# Patient Record
Sex: Male | Born: 1975 | Race: White | Hispanic: No | Marital: Married | Smoking: Former smoker
Health system: Southern US, Community
[De-identification: ages and names within clinical notes are randomized; demographics above are authoritative.]

## PROBLEM LIST (undated history)

## (undated) DIAGNOSIS — K219 Gastro-esophageal reflux disease without esophagitis: Secondary | ICD-10-CM

## (undated) DIAGNOSIS — E079 Disorder of thyroid, unspecified: Secondary | ICD-10-CM

## (undated) DIAGNOSIS — T7840XA Allergy, unspecified, initial encounter: Secondary | ICD-10-CM

## (undated) HISTORY — DX: Allergy, unspecified, initial encounter: T78.40XA

## (undated) HISTORY — DX: Disorder of thyroid, unspecified: E07.9

## (undated) HISTORY — DX: Gastro-esophageal reflux disease without esophagitis: K21.9

---

## 2000-06-24 HISTORY — PX: APPENDECTOMY: SHX54

## 2015-07-17 ENCOUNTER — Encounter: Payer: Self-pay | Admitting: Family Medicine

## 2015-07-17 ENCOUNTER — Ambulatory Visit (INDEPENDENT_AMBULATORY_CARE_PROVIDER_SITE_OTHER): Payer: BLUE CROSS/BLUE SHIELD | Admitting: Family Medicine

## 2015-07-17 VITALS — BP 122/80 | HR 64 | Resp 16 | Ht 75.0 in | Wt 247.0 lb

## 2015-07-17 DIAGNOSIS — J302 Other seasonal allergic rhinitis: Secondary | ICD-10-CM | POA: Diagnosis not present

## 2015-07-17 DIAGNOSIS — E039 Hypothyroidism, unspecified: Secondary | ICD-10-CM | POA: Diagnosis not present

## 2015-07-17 DIAGNOSIS — E669 Obesity, unspecified: Secondary | ICD-10-CM | POA: Diagnosis not present

## 2015-07-17 DIAGNOSIS — K219 Gastro-esophageal reflux disease without esophagitis: Secondary | ICD-10-CM | POA: Diagnosis not present

## 2015-07-17 DIAGNOSIS — Z23 Encounter for immunization: Secondary | ICD-10-CM | POA: Diagnosis not present

## 2015-07-17 DIAGNOSIS — E559 Vitamin D deficiency, unspecified: Secondary | ICD-10-CM

## 2015-07-17 DIAGNOSIS — L853 Xerosis cutis: Secondary | ICD-10-CM | POA: Diagnosis not present

## 2015-07-18 LAB — COMPREHENSIVE METABOLIC PANEL
ALT: 16 IU/L (ref 0–44)
AST: 14 IU/L (ref 0–40)
Albumin/Globulin Ratio: 2 (ref 1.1–2.5)
Albumin: 4.8 g/dL (ref 3.5–5.5)
Alkaline Phosphatase: 75 IU/L (ref 39–117)
BUN/Creatinine Ratio: 17 (ref 8–19)
BUN: 20 mg/dL (ref 6–20)
Bilirubin Total: 0.4 mg/dL (ref 0.0–1.2)
CALCIUM: 9.4 mg/dL (ref 8.7–10.2)
CO2: 24 mmol/L (ref 18–29)
CREATININE: 1.17 mg/dL (ref 0.76–1.27)
Chloride: 101 mmol/L (ref 96–106)
GFR calc Af Amer: 90 mL/min/{1.73_m2} (ref >59)
GFR, EST NON AFRICAN AMERICAN: 78 mL/min/{1.73_m2} (ref >59)
GLOBULIN, TOTAL: 2.4 g/dL (ref 1.5–4.5)
Glucose: 90 mg/dL (ref 65–99)
Potassium: 4.1 mmol/L (ref 3.5–5.2)
SODIUM: 141 mmol/L (ref 134–144)
Total Protein: 7.2 g/dL (ref 6.0–8.5)

## 2015-07-18 LAB — LIPID PANEL
CHOLESTEROL TOTAL: 187 mg/dL (ref 100–199)
Chol/HDL Ratio: 4.2 ratio units (ref 0.0–5.0)
HDL: 45 mg/dL (ref >39)
LDL CALC: 103 mg/dL — AB (ref 0–99)
Triglycerides: 193 mg/dL — ABNORMAL HIGH (ref 0–149)
VLDL Cholesterol Cal: 39 mg/dL (ref 5–40)

## 2015-07-18 LAB — CBC
HEMOGLOBIN: 14.6 g/dL (ref 12.6–17.7)
Hematocrit: 43.7 % (ref 37.5–51.0)
MCH: 29.8 pg (ref 26.6–33.0)
MCHC: 33.4 g/dL (ref 31.5–35.7)
MCV: 89 fL (ref 79–97)
Platelets: 276 10*3/uL (ref 150–379)
RBC: 4.9 x10E6/uL (ref 4.14–5.80)
RDW: 13.4 % (ref 12.3–15.4)
WBC: 8.1 10*3/uL (ref 3.4–10.8)

## 2015-07-18 LAB — VITAMIN D 25 HYDROXY (VIT D DEFICIENCY, FRACTURES): Vit D, 25-Hydroxy: 22.1 ng/mL — ABNORMAL LOW (ref 30.0–100.0)

## 2015-07-18 LAB — TSH: TSH: 6.12 u[IU]/mL — ABNORMAL HIGH (ref 0.450–4.500)

## 2015-07-19 DIAGNOSIS — E559 Vitamin D deficiency, unspecified: Secondary | ICD-10-CM | POA: Insufficient documentation

## 2015-07-19 DIAGNOSIS — E039 Hypothyroidism, unspecified: Secondary | ICD-10-CM | POA: Insufficient documentation

## 2015-07-19 MED ORDER — LEVOTHYROXINE SODIUM 25 MCG PO TABS: 25.0000 ug | ORAL_TABLET | Freq: Every day | ORAL | Status: DC

## 2015-07-19 NOTE — Progress Notes (Signed)
Date:  07/17/2015   Name:  Joshua Barrera   DOB:  1976/03/24   MRN:  [id number]  PCP:  No primary care provider on file.    Chief Complaint: Establish Care   History of Present Illness:  This is a 40 y.o. male to establish care. Weight increased 20-25# past year. Takes OTC omeprazole 3-5 times/week for several years for GERD, never had EGD. Lipids checked 5 yrs ago, ok then. Father died NHL/PE, mother with depression, sibs healthy. Needs flu and tetanus imms. Has occ loose stools.  Review of Systems:  Review of Systems  Constitutional: Negative for fever and fatigue.  HENT: Negative for ear pain and sore throat.   Eyes: Negative for pain.  Respiratory: Negative for shortness of breath.   Cardiovascular: Negative for chest pain and leg swelling.  Gastrointestinal: Negative for abdominal pain.  Endocrine: Negative for polyuria.  Genitourinary: Negative for difficulty urinating.  Neurological: Negative for syncope and light-headedness.    Patient Active Problem List   Diagnosis Date Noted  . Acid reflux 07/17/2015  . Seasonal allergies 07/17/2015  . Dry skin 07/17/2015    Prior to Admission medications   Medication Sig Start Date End Date Taking? Authorizing Provider  omeprazole (PRILOSEC) 20 MG capsule Take 20 mg by mouth daily.   Yes Historical Provider, MD    Allergies  Allergen Reactions  . Latex     Past Surgical History  Procedure Laterality Date  . Appendectomy  2002    Social History  Substance Use Topics  . Smoking status: Former Smoker    Types: Cigarettes, Cigars    Quit date: 07/17/2003  . Smokeless tobacco: Never Used  . Alcohol Use: 3.0 oz/week    5 Cans of beer per week    Family History  Problem Relation Age of Onset  . Lymphoma Father   . Cancer Maternal Grandmother   . Cancer Paternal Grandmother   . Birth defects Paternal Grandmother   . Heart attack Paternal Grandfather     Medication list has been reviewed and updated.  Physical  Examination: BP 122/80 mmHg  Pulse 64  Resp 16  Ht  (1.854 m)  Wt 247 lb (112.038 kg)  BMI 32.59 kg/m2  SpO2 98%  Physical Exam  Constitutional: He is oriented to person, place, and time. He appears well-developed and well-nourished.  HENT:  Head: Normocephalic and atraumatic.  Right Ear: External ear normal.  Left Ear: External ear normal.  Nose: Nose normal.  Mouth/Throat: Oropharynx is clear and moist.  TM's clear  Eyes: Conjunctivae and EOM are normal. Pupils are equal, round, and reactive to light.  Neck: Neck supple. No thyromegaly present.  Cardiovascular: Normal rate, regular rhythm and normal heart sounds.   Pulmonary/Chest: Effort normal and breath sounds normal.  Abdominal: Soft. He exhibits no distension and no mass. There is no tenderness.  Musculoskeletal: He exhibits no edema.  Lymphadenopathy:    He has no cervical adenopathy.  Neurological: He is alert and oriented to person, place, and time. Coordination normal.  Skin: Skin is warm and dry.  Psychiatric: He has a normal mood and affect. His behavior is normal.  Nursing note and vitals reviewed.   Assessment and Plan:  1. Gastroesophageal reflux disease, esophagitis presence not specified Well controlled on omeprazole, advised using prn only  2. Seasonal allergies Well controlled on OTC meds  3. Dry skin Lubricant bid and within 3 minutes of bath/shower  4. Obesity, Class I, BMI 30-34.9 Exercise (150 mins/wk)  and weight loss discussed - Lipid Profile - Comprehensive Metabolic Panel (CMET) - CBC - TSH - Vitamin D (25 hydroxy)  5. Need for influenza vaccination - Flu Vaccine QUAD 36+ mos PF IM (Fluarix & Fluzone Quad PF)  6. Need for Tdap vaccination - Tdap vaccine greater than or equal to 7yo IM  Return in about 4 weeks (around 08/14/2015).  Dionne Ano. Kingsley Spittle MD [facility]  07/19/2015

## 2015-07-19 NOTE — Addendum Note (Signed)
Addended by: Schuyler Amor on: 07/19/2015 10:50 AM   Modules accepted: Orders

## 2015-08-14 ENCOUNTER — Ambulatory Visit (INDEPENDENT_AMBULATORY_CARE_PROVIDER_SITE_OTHER): Payer: BLUE CROSS/BLUE SHIELD | Admitting: Family Medicine

## 2015-08-14 ENCOUNTER — Encounter: Payer: Self-pay | Admitting: Family Medicine

## 2015-08-14 VITALS — BP 136/80 | HR 64 | Ht 75.0 in | Wt 244.0 lb

## 2015-08-14 DIAGNOSIS — K219 Gastro-esophageal reflux disease without esophagitis: Secondary | ICD-10-CM

## 2015-08-14 DIAGNOSIS — E039 Hypothyroidism, unspecified: Secondary | ICD-10-CM

## 2015-08-14 DIAGNOSIS — E559 Vitamin D deficiency, unspecified: Secondary | ICD-10-CM

## 2015-08-14 DIAGNOSIS — R0789 Other chest pain: Secondary | ICD-10-CM | POA: Diagnosis not present

## 2015-08-14 DIAGNOSIS — J302 Other seasonal allergic rhinitis: Secondary | ICD-10-CM | POA: Diagnosis not present

## 2015-08-14 MED ORDER — VITAMIN D 50 MCG (2000 UT) PO CAPS: 1.0000 | ORAL_CAPSULE | Freq: Every day | ORAL | Status: DC

## 2015-08-14 NOTE — Progress Notes (Signed)
Date:  08/14/2015   Name:  Joshua Barrera   DOB:  30-Apr-1976   MRN:  [id number]  PCP:  No primary care provider on file.    Chief Complaint: Follow-up   History of Present Illness:  This is a 40 y.o. male seen in f/u from initial visit 1 month ago. Blood work showed hypothyroidism and vit D def, taking Synthroid and vit D supp daily. Has been having some int R upper chest pain past 2 months, exertional and non-exertional, often worse with deep breath and R arm movement, no ass nausea or SOB. Reflux about the same, using omeprazole 3-4x/wk. AR about the same, has lost 3# past month, exercising more.  Review of Systems:  Review of Systems  Constitutional: Negative for fever.  Respiratory: Negative for shortness of breath.   Cardiovascular: Negative for leg swelling.  Gastrointestinal: Negative for abdominal pain.  Neurological: Negative for syncope and light-headedness.    Patient Active Problem List   Diagnosis Date Noted  . Hypothyroidism 07/19/2015  . Vitamin D deficiency 07/19/2015  . Acid reflux 07/17/2015  . Seasonal allergies 07/17/2015  . Dry skin 07/17/2015    Prior to Admission medications   Medication Sig Start Date End Date Taking? Authorizing Provider  levothyroxine (SYNTHROID, LEVOTHROID) 25 MCG tablet Take 1 tablet (25 mcg total) by mouth daily before breakfast. 07/19/15  Yes Schuyler Amor, MD  omeprazole (PRILOSEC) 20 MG capsule Take 20 mg by mouth daily.   Yes Historical Provider, MD  Cholecalciferol (VITAMIN D) 2000 units CAPS Take 1 capsule (2,000 Units total) by mouth daily. 08/14/15   Schuyler Amor, MD    Allergies  Allergen Reactions  . Latex     Past Surgical History  Procedure Laterality Date  . Appendectomy  2002    Social History  Substance Use Topics  . Smoking status: Former Smoker    Types: Cigarettes, Cigars    Quit date: 07/17/2003  . Smokeless tobacco: Never Used  . Alcohol Use: 3.0 oz/week    5 Cans of beer per week    Family History   Problem Relation Age of Onset  . Lymphoma Father   . Cancer Maternal Grandmother   . Cancer Paternal Grandmother   . Birth defects Paternal Grandmother   . Heart attack Paternal Grandfather     Medication list has been reviewed and updated.  Physical Examination: BP 136/80 mmHg  Pulse 64  Ht  (1.905 m)  Wt 244 lb (110.678 kg)  BMI 30.50 kg/m2  Physical Exam  Constitutional: He appears well-developed and well-nourished.  Cardiovascular: Normal rate, regular rhythm and normal heart sounds.   Pulmonary/Chest: Effort normal and breath sounds normal.  Musculoskeletal: He exhibits no edema.  Mild R upper chest pain with raising R arm, chest non-tender  Nursing note and vitals reviewed.   Assessment and Plan:  1. Musculoskeletal chest pain Likely benign, not progressive, monitor, consider CXR/NSAID if sxs worsen/persist  2. Hypothyroidism, unspecified hypothyroidism type On Synthroid x 1 month - TSH  3. Vitamin D deficiency On supplement x 1 month - Vitamin D (25 hydroxy)  4. Gastroesophageal reflux disease, esophagitis presence not specified Adequate control on prn omeprazole, discussed limiting use  5. Seasonal allergies Stable  Return in about 3 months (around 11/11/2015).  Dionne Ano. Kingsley Spittle MD [facility]  08/14/2015

## 2015-08-15 LAB — VITAMIN D 25 HYDROXY (VIT D DEFICIENCY, FRACTURES): Vit D, 25-Hydroxy: 23.3 ng/mL — ABNORMAL LOW (ref 30.0–100.0)

## 2015-08-15 LAB — TSH: TSH: 3.14 u[IU]/mL (ref 0.450–4.500)

## 2015-08-16 ENCOUNTER — Telehealth: Payer: Self-pay

## 2015-08-16 NOTE — Telephone Encounter (Signed)
Left message for patient to call back  

## 2015-08-16 NOTE — Telephone Encounter (Signed)
-----   Message from Schuyler Amor, MD sent at 08/15/2015  9:11 AM EST ----- Inform thyroid level now normal but vit D level only slightly improved, recommend increase vit D supplementation to 4000 units daily (double current dose).

## 2015-08-16 NOTE — Telephone Encounter (Signed)
Spoke with patient. Patient advised of all results and verbalized understanding. Will call back with any future questions or concerns. MAH  

## 2015-11-10 ENCOUNTER — Ambulatory Visit: Payer: BLUE CROSS/BLUE SHIELD | Admitting: Family Medicine

## 2015-11-15 ENCOUNTER — Ambulatory Visit (INDEPENDENT_AMBULATORY_CARE_PROVIDER_SITE_OTHER): Payer: BLUE CROSS/BLUE SHIELD | Admitting: Family Medicine

## 2015-11-15 ENCOUNTER — Encounter: Payer: Self-pay | Admitting: Family Medicine

## 2015-11-15 VITALS — BP 120/82 | HR 80 | Ht 75.0 in | Wt 250.0 lb

## 2015-11-15 DIAGNOSIS — E559 Vitamin D deficiency, unspecified: Secondary | ICD-10-CM

## 2015-11-15 DIAGNOSIS — E669 Obesity, unspecified: Secondary | ICD-10-CM

## 2015-11-15 DIAGNOSIS — E039 Hypothyroidism, unspecified: Secondary | ICD-10-CM

## 2015-11-15 DIAGNOSIS — K219 Gastro-esophageal reflux disease without esophagitis: Secondary | ICD-10-CM

## 2015-11-15 DIAGNOSIS — E66811 Obesity, class 1: Secondary | ICD-10-CM | POA: Insufficient documentation

## 2015-11-15 MED ORDER — CHOLECALCIFEROL 125 MCG (5000 UT) PO CAPS: 5000.0000 [IU] | ORAL_CAPSULE | Freq: Every day | ORAL | Status: DC

## 2015-11-15 MED ORDER — LEVOTHYROXINE SODIUM 25 MCG PO TABS: 25.0000 ug | ORAL_TABLET | Freq: Every day | ORAL | Status: DC

## 2015-11-15 NOTE — Progress Notes (Signed)
Date:  11/15/2015   Name:  Joshua Barrera   DOB:  30-Jan-1976   MRN:  [id number]  PCP:  Schuyler AmorWilliam Bevan Disney, MD    Chief Complaint: Follow-up   History of Present Illness:  This is a 40 y.o. male seen for 3 month f/u. Chest pains have resolved but GERD is worse, having to take Prilosec daily now to control sxs. Having trouble remembering to take Synthroid and vit D supp regularly. Weight up 6#, trying to exercise more. AR sxs stable. No new concerns.  Review of Systems:  Review of Systems  Constitutional: Negative for fever and fatigue.  Respiratory: Negative for cough and shortness of breath.   Cardiovascular: Negative for chest pain and leg swelling.  Neurological: Negative for syncope and light-headedness.    Patient Active Problem List   Diagnosis Date Noted  . Obesity, Class I, BMI 30-34.9 11/15/2015  . Hypothyroidism 07/19/2015  . Vitamin D deficiency 07/19/2015  . Acid reflux 07/17/2015  . Seasonal allergies 07/17/2015  . Dry skin 07/17/2015    Prior to Admission medications   Medication Sig Start Date End Date Taking? Authorizing Provider  omeprazole (PRILOSEC) 20 MG capsule Take 20 mg by mouth daily.   Yes Historical Provider, MD  Cholecalciferol 5000 units capsule Take 1 capsule (5,000 Units total) by mouth daily. 11/15/15   Schuyler AmorWilliam Byard Carranza, MD  levothyroxine (SYNTHROID, LEVOTHROID) 25 MCG tablet Take 1 tablet (25 mcg total) by mouth daily. 11/15/15   Schuyler AmorWilliam Ansley Mangiapane, MD    Allergies  Allergen Reactions  . Latex     Past Surgical History  Procedure Laterality Date  . Appendectomy  2002    Social History  Substance Use Topics  . Smoking status: Former Smoker    Types: Cigarettes, Cigars    Quit date: 07/17/2003  . Smokeless tobacco: Never Used  . Alcohol Use: 3.0 oz/week    5 Cans of beer per week    Family History  Problem Relation Age of Onset  . Lymphoma Father   . Cancer Maternal Grandmother   . Cancer Paternal Grandmother   . Birth defects Paternal  Grandmother   . Heart attack Paternal Grandfather     Medication list has been reviewed and updated.  Physical Examination: BP 120/82 mmHg  Pulse 80  Ht 6\' 3"  (1.905 m)  Wt 250 lb (113.399 kg)  BMI 31.25 kg/m2  Physical Exam  Constitutional: He appears well-developed and well-nourished.  Cardiovascular: Normal rate, regular rhythm and normal heart sounds.   Pulmonary/Chest: Effort normal and breath sounds normal.  Musculoskeletal: He exhibits no edema.  Neurological: He is alert.  Skin: Skin is warm and dry.  Psychiatric: He has a normal mood and affect. His behavior is normal.  Nursing note and vitals reviewed.   Assessment and Plan:  1. Hypothyroidism, unspecified hypothyroidism type Well controlled, cont Synthroid, consider repeat TSH next visit  2. Gastroesophageal reflux disease, esophagitis presence not specified Worse, strategies discussed, cont omeprazole, try to avoid daily use, consider EGD if sxs persist  3. Vitamin D deficiency On increased supplement but not taking regularly, continue supplement, consider repeat level next visit  4. Obesity, Class I, BMI 30-34.9 Exercise, weight loss discussed  Return in about 6 months (around 05/17/2016).  Dionne AnoWilliam M. Kingsley SpittlePlonk, Jr. MD [facility]  11/15/2015

## 2015-11-15 NOTE — Patient Instructions (Signed)

## 2015-11-29 ENCOUNTER — Other Ambulatory Visit: Payer: Self-pay | Admitting: Family Medicine

## 2015-11-29 ENCOUNTER — Telehealth: Payer: Self-pay

## 2015-11-29 MED ORDER — LEVOTHYROXINE SODIUM 25 MCG PO TABS: 25.0000 ug | ORAL_TABLET | Freq: Every day | ORAL | Status: DC

## 2015-11-29 NOTE — Telephone Encounter (Signed)
Rx Synthroid sent

## 2015-11-29 NOTE — Telephone Encounter (Signed)
Sent to [facility] 

## 2016-05-15 ENCOUNTER — Ambulatory Visit: Payer: BLUE CROSS/BLUE SHIELD | Admitting: Internal Medicine

## 2016-05-21 ENCOUNTER — Ambulatory Visit: Payer: BLUE CROSS/BLUE SHIELD | Admitting: Internal Medicine

## 2016-05-22 ENCOUNTER — Ambulatory Visit (INDEPENDENT_AMBULATORY_CARE_PROVIDER_SITE_OTHER): Payer: BLUE CROSS/BLUE SHIELD | Admitting: Internal Medicine

## 2016-05-22 ENCOUNTER — Encounter: Payer: Self-pay | Admitting: Internal Medicine

## 2016-05-22 VITALS — BP 118/80 | HR 93 | Resp 16 | Ht 75.0 in | Wt 258.8 lb

## 2016-05-22 DIAGNOSIS — Z23 Encounter for immunization: Secondary | ICD-10-CM

## 2016-05-22 DIAGNOSIS — E039 Hypothyroidism, unspecified: Secondary | ICD-10-CM

## 2016-05-22 DIAGNOSIS — K219 Gastro-esophageal reflux disease without esophagitis: Secondary | ICD-10-CM | POA: Diagnosis not present

## 2016-05-22 DIAGNOSIS — E559 Vitamin D deficiency, unspecified: Secondary | ICD-10-CM | POA: Diagnosis not present

## 2016-05-22 MED ORDER — RANITIDINE HCL 300 MG PO TABS: 300.0000 mg | ORAL_TABLET | Freq: Every day | ORAL | 5 refills | Status: DC

## 2016-05-22 NOTE — Progress Notes (Signed)
Date:  05/22/2016   Name:  Joshua Barrera   DOB:  Oct 04, 1975   MRN:  [id number]   Chief Complaint: Hypothyroidism (6 mo fu just started taking meds again after being off 2-3 months. )  Thyroid Problem  Presents for follow-up visit. Symptoms include weight gain. Patient reports no constipation, diaphoresis, dry skin, fatigue or palpitations. (Stopped medication for several months then restarted it last week) The symptoms have been stable.  Gastroesophageal Reflux  He complains of abdominal pain and heartburn. He reports no chest pain, no nausea, no sore throat or no wheezing. This is a chronic problem. The problem occurs frequently. The heartburn is located in the substernum. The heartburn wakes him from sleep. The symptoms are aggravated by lying down. Pertinent negatives include no fatigue. There are no known risk factors.  He stopped nexium over concerns for other side effects.  He has waterbrash most nights - not quite as severe since raising the head of his bed.   Review of Systems  Constitutional: Positive for weight gain. Negative for diaphoresis, fatigue and unexpected weight change.  HENT: Negative for sore throat.   Eyes: Negative for visual disturbance.  Respiratory: Negative for chest tightness, shortness of breath and wheezing.   Cardiovascular: Negative for chest pain, palpitations and leg swelling.  Gastrointestinal: Positive for abdominal pain and heartburn. Negative for blood in stool, constipation, nausea and vomiting.  Neurological: Negative for dizziness and weakness.  Psychiatric/Behavioral: Negative for sleep disturbance.    Patient Active Problem List   Diagnosis Date Noted  . Obesity, Class I, BMI 30-34.9 11/15/2015  . Hypothyroidism 07/19/2015  . Vitamin D deficiency 07/19/2015  . Acid reflux 07/17/2015  . Seasonal allergies 07/17/2015    Prior to Admission medications   Medication Sig Start Date End Date Taking? Authorizing Provider  Cholecalciferol 5000  units capsule Take 1 capsule (5,000 Units total) by mouth daily. 11/15/15  Yes Schuyler AmorWilliam [facility], MD  levothyroxine (SYNTHROID, LEVOTHROID) 25 MCG tablet Take 1 tablet (25 mcg total) by mouth daily. 11/29/15  Yes Schuyler AmorWilliam [facility], MD    Allergies  Allergen Reactions  . Latex     Past Surgical History:  Procedure Laterality Date  . APPENDECTOMY  2002    Social History  Substance Use Topics  . Smoking status: Former Smoker    Types: Cigarettes, Cigars    Quit date: 07/17/2003  . Smokeless tobacco: Never Used  . Alcohol use 3.0 oz/week    5 Cans of beer per week     Medication list has been reviewed and updated.   Physical Exam  Constitutional: He is oriented to person, place, and time. He appears well-developed. No distress.  HENT:  Head: Normocephalic and atraumatic.  Neck: Normal range of motion. Neck supple. No thyromegaly present.  Cardiovascular: Normal rate, regular rhythm and normal heart sounds.   Pulmonary/Chest: Effort normal and breath sounds normal. No respiratory distress. He has no wheezes.  Abdominal: Soft. Bowel sounds are normal. He exhibits no mass. There is no tenderness. There is no rebound and no guarding.  Musculoskeletal: He exhibits no edema or tenderness.  Neurological: He is alert and oriented to person, place, and time.  Skin: Skin is warm and dry. No rash noted.  Psychiatric: He has a normal mood and affect. His behavior is normal. Thought content normal.  Nursing note and vitals reviewed.   BP 118/80   Pulse 93   Resp 16   Ht 6\' 3"  (1.905 m)   Wt 258 lb  12.8 oz (117.4 kg)   SpO2 98%   BMI 32.35 kg/m   Assessment and Plan: 1. Hypothyroidism, unspecified type Continue synthroid daily- return for labs in 2 months - TSH; Future  2. Gastroesophageal reflux disease, esophagitis presence not specified Not controlled off of nexium - will try zantac and test for H pylori - ranitidine (ZANTAC) 300 MG tablet; Take 1 tablet (300 mg total) by mouth at  bedtime.  Dispense: 30 tablet; Refill: 5 - H. pylori antibody, IgG; Future  3. Vitamin D deficiency Continue supplementation   Bari EdwardLaura Chery Giusto, MD [facility]  05/22/2016

## 2016-06-06 ENCOUNTER — Telehealth: Payer: Self-pay | Admitting: Internal Medicine

## 2016-06-06 NOTE — Telephone Encounter (Signed)
Pt called stated joined the gymn--need to have a approval letter due of having thyroid condition send to the trainer to do assesment

## 2016-06-07 NOTE — Telephone Encounter (Signed)
Note written on Rx pad,  He can pick it up.

## 2017-01-20 ENCOUNTER — Ambulatory Visit: Payer: BLUE CROSS/BLUE SHIELD | Admitting: Internal Medicine

## 2017-01-31 ENCOUNTER — Encounter: Payer: Self-pay | Admitting: Internal Medicine

## 2017-01-31 ENCOUNTER — Ambulatory Visit (INDEPENDENT_AMBULATORY_CARE_PROVIDER_SITE_OTHER): Payer: BLUE CROSS/BLUE SHIELD | Admitting: Internal Medicine

## 2017-01-31 VITALS — BP 122/70 | HR 73 | Temp 98.1°F | Ht 75.0 in | Wt 258.4 lb

## 2017-01-31 DIAGNOSIS — E559 Vitamin D deficiency, unspecified: Secondary | ICD-10-CM

## 2017-01-31 DIAGNOSIS — E034 Atrophy of thyroid (acquired): Secondary | ICD-10-CM | POA: Diagnosis not present

## 2017-01-31 DIAGNOSIS — K219 Gastro-esophageal reflux disease without esophagitis: Secondary | ICD-10-CM

## 2017-01-31 NOTE — Progress Notes (Signed)
Date:  01/31/2017   Name:  Joshua Barrera   DOB:  [birth year]   MRN:  [id number]   Chief Complaint: Hypothyroidism and Gastroesophageal Reflux Gastroesophageal Reflux  He reports no chest pain, no sore throat or no wheezing. Pertinent negatives include no fatigue. He has tried a histamine-2 antagonist for the symptoms. The treatment provided significant relief.  Thyroid Problem  Presents for follow-up (stopped medication at least 5 months ago) visit. Patient reports no constipation, diarrhea, fatigue or palpitations.  He feels well, his energy level, weight and sleep are good.    Review of Systems  Constitutional: Negative for chills, fatigue and fever.  HENT: Negative for sore throat and trouble swallowing.   Respiratory: Negative for chest tightness, shortness of breath and wheezing.   Cardiovascular: Negative for chest pain, palpitations and leg swelling.  Gastrointestinal: Negative for abdominal distention, constipation and diarrhea.  Genitourinary: Negative for difficulty urinating.  Musculoskeletal: Negative for neck pain and neck stiffness.  Neurological: Negative for dizziness and headaches.    Patient Active Problem List   Diagnosis Date Noted  . Hypothyroidism due to acquired atrophy of thyroid 01/31/2017  . Obesity, Class I, BMI 30-34.9 11/15/2015  . Vitamin D deficiency 07/19/2015  . Acid reflux 07/17/2015  . Seasonal allergies 07/17/2015    Prior to Admission medications   Medication Sig Start Date End Date Taking? Authorizing Provider  Cholecalciferol 5000 units capsule Take 1 capsule (5,000 Units total) by mouth daily. 11/15/15   [facility], Chrissie NoaWilliam, MD  levothyroxine (SYNTHROID, LEVOTHROID) 25 MCG tablet Take 1 tablet (25 mcg total) by mouth daily. 11/29/15   [facility], Chrissie NoaWilliam, MD  ranitidine (ZANTAC) 300 MG tablet Take 1 tablet (300 mg total) by mouth at bedtime. 05/22/16   Reubin MilanBerglund, Clearence Vitug H, MD    Allergies  Allergen Reactions  . Latex     Past Surgical History:    Procedure Laterality Date  . APPENDECTOMY  2002    Social History  Substance Use Topics  . Smoking status: Former Smoker    Types: Cigarettes, Cigars    Quit date: 07/17/2003  . Smokeless tobacco: Never Used  . Alcohol use 3.0 oz/week    5 Cans of beer per week     Medication list has been reviewed and updated.   Physical Exam  Constitutional: He is oriented to person, place, and time. He appears well-developed. No distress.  HENT:  Head: Normocephalic and atraumatic.  Neck: Normal range of motion. Neck supple. No thyromegaly present.  Cardiovascular: Normal rate, regular rhythm and normal heart sounds.   Pulmonary/Chest: Effort normal and breath sounds normal. No respiratory distress. He has no wheezes.  Abdominal: Soft. Bowel sounds are normal. He exhibits no distension. There is no tenderness.  Musculoskeletal: Normal range of motion. He exhibits no edema.  Lymphadenopathy:    He has no cervical adenopathy.  Neurological: He is alert and oriented to person, place, and time.  Skin: Skin is warm and dry. No rash noted.  Psychiatric: He has a normal mood and affect. His behavior is normal. Thought content normal.  Nursing note and vitals reviewed.   BP 122/70 (BP Location: Left Arm, Patient Position: Sitting, Cuff Size: Large)   Pulse 73   Temp 98.1 F (36.7 C) (Oral)   Ht 6\' 3"  (1.905 m)   Wt 258 lb 6.4 oz (117.2 kg)   SpO2 97%   BMI 32.30 kg/m   Assessment and Plan: 1. Hypothyroidism due to acquired atrophy of thyroid Remain off medications unless  TSH much higher - TSH  2. Gastroesophageal reflux disease, esophagitis presence not specified Continue zantac PRN  3. Vitamin D deficiency Recommend that he continue supplementation whenever he remembers it.   No orders of the defined types were placed in this encounter.   Bari Edward, MD [facility]  01/31/2017

## 2017-02-14 ENCOUNTER — Telehealth: Payer: Self-pay | Admitting: Internal Medicine

## 2017-02-14 NOTE — Telephone Encounter (Signed)
Call patient to find out why he did not get thyroid labs done.

## 2017-02-14 NOTE — Telephone Encounter (Signed)
Called pt and left Vm. Awaiting call back.

## 2017-05-21 ENCOUNTER — Ambulatory Visit (INDEPENDENT_AMBULATORY_CARE_PROVIDER_SITE_OTHER): Payer: BLUE CROSS/BLUE SHIELD | Admitting: Internal Medicine

## 2017-05-21 ENCOUNTER — Encounter: Payer: Self-pay | Admitting: Internal Medicine

## 2017-05-21 VITALS — BP 118/78 | HR 89 | Ht 75.0 in | Wt 244.0 lb

## 2017-05-21 DIAGNOSIS — H6692 Otitis media, unspecified, left ear: Secondary | ICD-10-CM | POA: Diagnosis not present

## 2017-05-21 DIAGNOSIS — H9312 Tinnitus, left ear: Secondary | ICD-10-CM | POA: Diagnosis not present

## 2017-05-21 DIAGNOSIS — R1111 Vomiting without nausea: Secondary | ICD-10-CM

## 2017-05-21 MED ORDER — AZITHROMYCIN 250 MG PO TABS: ORAL_TABLET | ORAL | 0 refills | Status: DC

## 2017-05-21 NOTE — Progress Notes (Signed)
Date:  05/21/2017   Name:  Joshua Barrera   DOB:  1975/09/02   MRN:  [id number]   Chief Complaint: Tinnitus (Left ear- started a couple of weeks ago. Thrown up twice in the last two weeks. No pain and drainage.   )  Emesis   This is a recurrent (two total episodes) problem. The emesis has an appearance of stomach contents. There has been no fever. Pertinent negatives include no abdominal pain, chest pain, dizziness, fever, headaches or weight loss. Risk factors: no warning both times.  He did stop taking zantac a few months ago when he changed his diet.  He has had no gerd sx. He has tried nothing for the symptoms.   Tinnitus - started about 3 weeks ago in the left ear.  Continuous but louder and softer at times.  No noticeable change in hearing.  No vertigo.  No ear pain or drainage.  No recent URI.  Review of Systems  Constitutional: Negative for diaphoresis, fatigue, fever, unexpected weight change and weight loss.  HENT: Positive for tinnitus. Negative for congestion, sinus pressure, trouble swallowing and voice change.   Eyes: Negative for visual disturbance.  Respiratory: Negative for chest tightness, shortness of breath and wheezing.   Cardiovascular: Negative for chest pain and palpitations.  Gastrointestinal: Positive for vomiting. Negative for abdominal pain, blood in stool, constipation and nausea.  Neurological: Negative for dizziness, tremors and headaches.    Patient Active Problem List   Diagnosis Date Noted  . Hypothyroidism due to acquired atrophy of thyroid 01/31/2017  . Obesity, Class I, BMI 30-34.9 11/15/2015  . Vitamin D deficiency 07/19/2015  . Acid reflux 07/17/2015  . Seasonal allergies 07/17/2015    Prior to Admission medications   Medication Sig Start Date End Date Taking? Authorizing Provider  Cholecalciferol 5000 units capsule Take 1 capsule (5,000 Units total) by mouth daily. Patient not taking: Reported on 05/21/2017 11/15/15   Schuyler AmorPlonk, William, MD    levothyroxine (SYNTHROID, LEVOTHROID) 25 MCG tablet Take 1 tablet (25 mcg total) by mouth daily. Patient not taking: Reported on 05/21/2017 11/29/15   Schuyler AmorPlonk, William, MD  ranitidine (ZANTAC) 300 MG tablet Take 1 tablet (300 mg total) by mouth at bedtime. Patient not taking: Reported on 01/31/2017 05/22/16   Reubin MilanBerglund, Jancarlo Biermann H, MD    Allergies  Allergen Reactions  . Latex     Past Surgical History:  Procedure Laterality Date  . APPENDECTOMY  2002    Social History   Tobacco Use  . Smoking status: Former Smoker    Types: Cigarettes, Cigars    Last attempt to quit: 07/17/2003    Years since quitting: 13.8  . Smokeless tobacco: Never Used  Substance Use Topics  . Alcohol use: Yes    Alcohol/week: 3.0 oz    Types: 5 Cans of beer per week  . Drug use: No     Medication list has been reviewed and updated.  PHQ 2/9 Scores 01/31/2017 11/15/2015 08/14/2015 07/17/2015  PHQ - 2 Score 0 0 0 0    Physical Exam  Constitutional: He is oriented to person, place, and time. He appears well-developed. No distress.  HENT:  Head: Normocephalic and atraumatic.  Right Ear: Tympanic membrane and ear canal normal.  Left Ear: Ear canal normal. Tympanic membrane is erythematous and retracted.  Nose: Right sinus exhibits no maxillary sinus tenderness and no frontal sinus tenderness. Left sinus exhibits no maxillary sinus tenderness and no frontal sinus tenderness.  Mouth/Throat: No posterior oropharyngeal edema  or posterior oropharyngeal erythema.  Neck: Normal range of motion. Neck supple.  Cardiovascular: Normal rate and normal heart sounds.  Pulmonary/Chest: Effort normal and breath sounds normal. No respiratory distress.  Abdominal: Soft. Normal appearance and bowel sounds are normal. There is no tenderness. There is no rigidity, no rebound and no guarding.  Musculoskeletal: Normal range of motion.  Neurological: He is alert and oriented to person, place, and time.  Skin: Skin is warm and dry. No  rash noted.  Psychiatric: He has a normal mood and affect. His behavior is normal. Thought content normal.  Nursing note and vitals reviewed.   BP 118/78   Pulse 89   Ht 6\' 3"  (1.905 m)   Wt 244 lb (110.7 kg)   SpO2 97%   BMI 30.50 kg/m   Assessment and Plan: 1. Otitis of left ear May be contributing to tinnitus  2. Tinnitus of left ear If no improvement after Zpak, will refer to ENT  3. Non-intractable vomiting without nausea, unspecified vomiting type May be due to silent GERD - will monitor for recurrence. Consider trial of Zantac   Meds ordered this encounter  Medications  . azithromycin (ZITHROMAX Z-PAK) 250 MG tablet    Sig: UAD    Dispense:  6 each    Refill:  0    Partially dictated using Animal nutritionistDragon software. Any errors are unintentional.  Bari EdwardLaura Kord Monette, MD [facility]  05/21/2017

## 2017-05-21 NOTE — Patient Instructions (Signed)
Call after antibiotics if the tinnitus is not improved for an ENT referral

## 2017-05-27 ENCOUNTER — Ambulatory Visit: Payer: BLUE CROSS/BLUE SHIELD | Admitting: Internal Medicine

## 2017-06-19 ENCOUNTER — Telehealth: Payer: Self-pay

## 2017-06-19 ENCOUNTER — Other Ambulatory Visit: Payer: Self-pay | Admitting: Internal Medicine

## 2017-06-19 DIAGNOSIS — H9319 Tinnitus, unspecified ear: Secondary | ICD-10-CM

## 2017-06-19 NOTE — Telephone Encounter (Signed)
Referral placed.

## 2017-06-19 NOTE — Telephone Encounter (Signed)
Patient informed of referral placed and someone will contact him to schedule appt.

## 2017-06-19 NOTE — Telephone Encounter (Signed)
Patient called stating he was told to call if ringing in Left ear got no better after taking antibiotics. He stated he is still having the ringing LEFT ear. Interested in ENT referral.  Please Advise.

## 2017-07-23 ENCOUNTER — Other Ambulatory Visit: Payer: Self-pay | Admitting: Otolaryngology

## 2017-07-23 DIAGNOSIS — H905 Unspecified sensorineural hearing loss: Secondary | ICD-10-CM

## 2017-07-29 ENCOUNTER — Ambulatory Visit
Admission: RE | Admit: 2017-07-29 | Discharge: 2017-07-29 | Disposition: A | Discharge status: Home / Self Care | Payer: BLUE CROSS/BLUE SHIELD | Source: Ambulatory Visit | Attending: Otolaryngology | Admitting: Otolaryngology

## 2017-07-29 DIAGNOSIS — H9312 Tinnitus, left ear: Secondary | ICD-10-CM | POA: Insufficient documentation

## 2017-07-29 DIAGNOSIS — H905 Unspecified sensorineural hearing loss: Secondary | ICD-10-CM | POA: Diagnosis present

## 2017-07-29 MED ORDER — GADOBENATE DIMEGLUMINE 529 MG/ML IV SOLN
20.0000 mL | Freq: Once | INTRAVENOUS | Status: AC | PRN
  Administered 2017-07-29: 20 mL via INTRAVENOUS

## 2018-02-06 ENCOUNTER — Encounter: Payer: BLUE CROSS/BLUE SHIELD | Admitting: Internal Medicine

## 2018-09-04 IMAGING — MR MR BRAIN/IAC WO/W
10 of 12 series · 34 of 48 positions shown · IV contrast (multihance)
Comparison: None.

CLINICAL DATA: Initial evaluation for ringing in left ear with
vertigo for 2 months.

EXAM:
MRI HEAD WITHOUT AND WITH CONTRAST
TECHNIQUE: Multiplanar, multiecho pulse sequences of the brain and surrounding
structures were obtained without and with intravenous contrast. An
IAC protocol was employed.
CONTRAST:  20mL MULTIHANCE GADOBENATE DIMEGLUMINE 529 MG/ML IV SOLN

[Series 3: T1 · sagittal · 5.0mm · 0.45mm/px · 3 of 28 slices shown (1 of 2)]
[im 1/28]
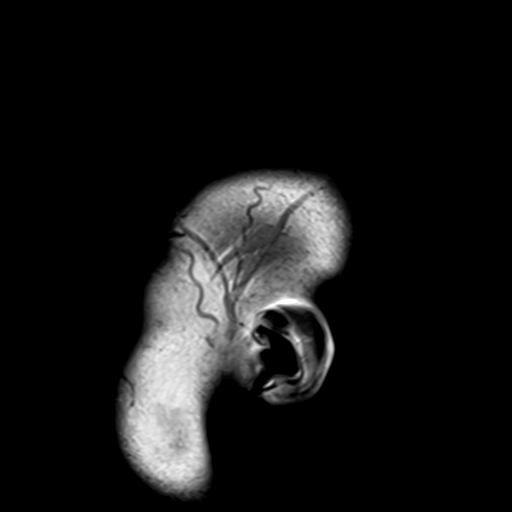
[im 14/28]
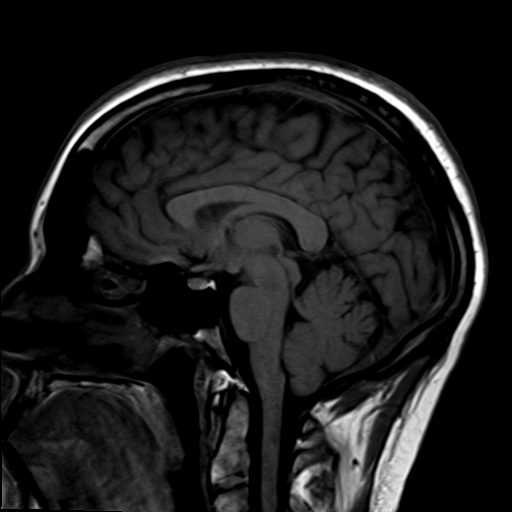
[im 28/28]
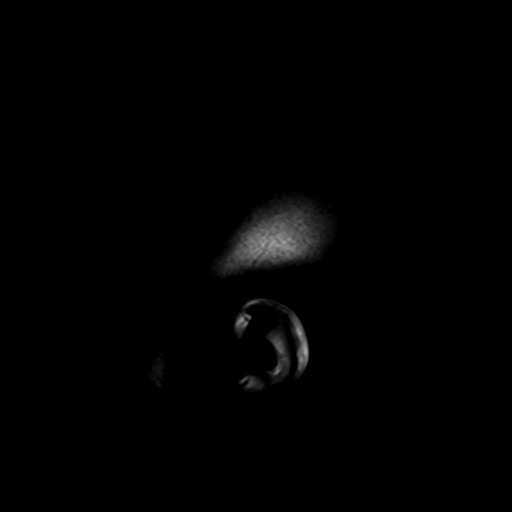

[Series 5: DWI · axial · 3.0mm · 1.80mm/px · z∈[-45,+120]mm · 4 of 44 slices shown]
[im 1/44]
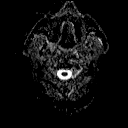
[im 15/44]
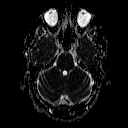
[im 29/44]
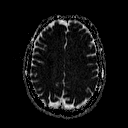
[im 44/44]
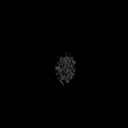

[Series 6: T2 · axial · 5.0mm · 0.60mm/px · z∈[-46,+120]mm · 3 of 27 slices shown (1 of 3)]
[im 1/27]
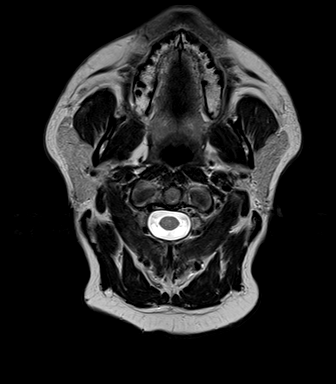
[im 14/27]
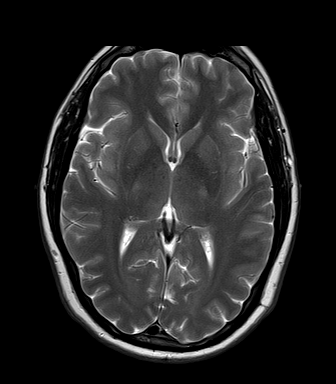
[im 27/27]
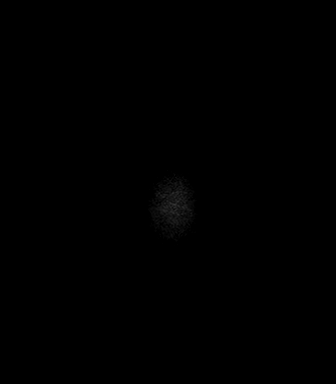

[Series 7: FLAIR · axial · 3.0mm · 0.45mm/px · z∈[-42,+117]mm · 5 of 55 slices shown]
[im 1/55]
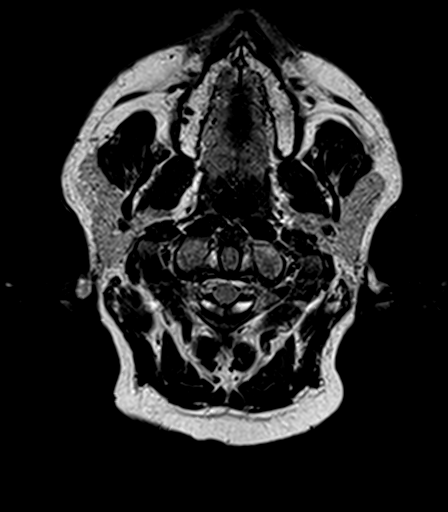
[im 14/55]
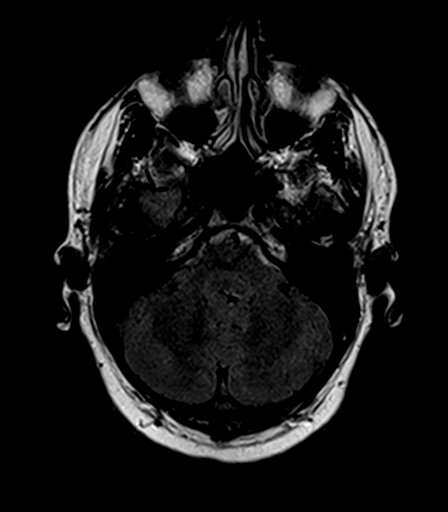
[im 28/55]
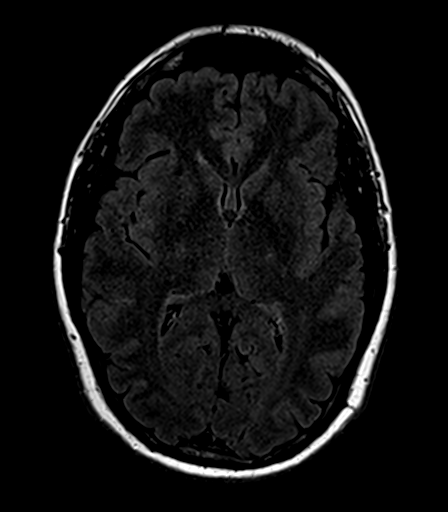
[im 41/55]
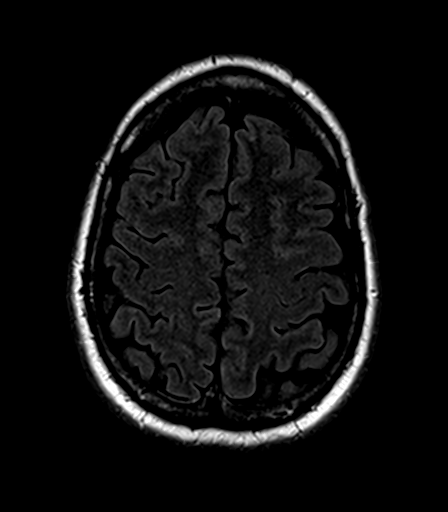
[im 55/55]
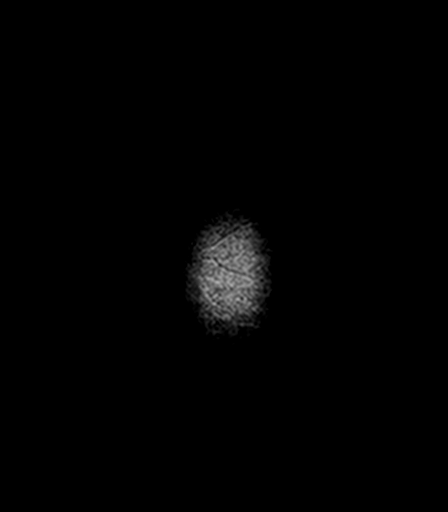

[Series 8: T1 · coronal · 3.0mm · 0.78mm/px · 1 of 13 slices shown (2 of 2)]
[im 1/13]
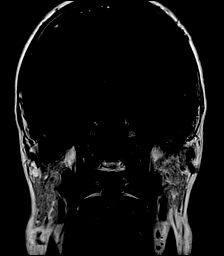

[Series 9: T2 · axial · 5.0mm · 0.45mm/px · z∈[-46,+120]mm · 3 of 27 slices shown (2 of 3)]
[im 1/27]
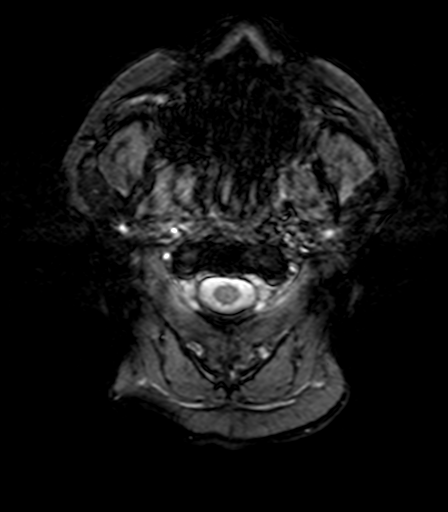
[im 14/27]
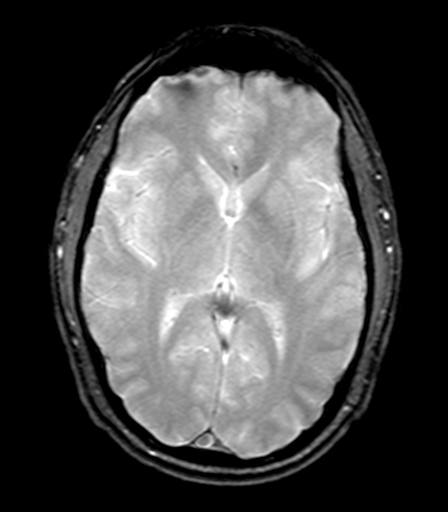
[im 27/27]
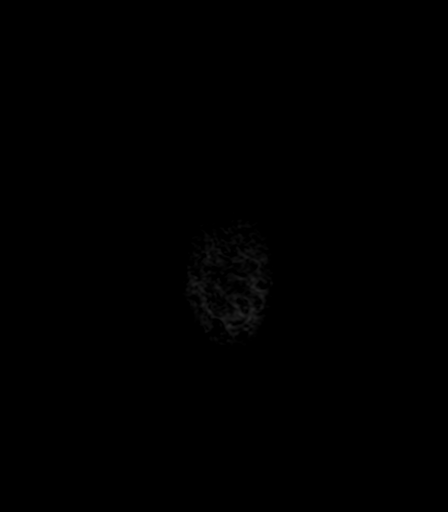

[Series 10: T2 · axial · 1.0mm · 0.35mm/px · z∈[-42,-3]mm · 4 of 40 slices shown (3 of 3)]
[im 1/40]
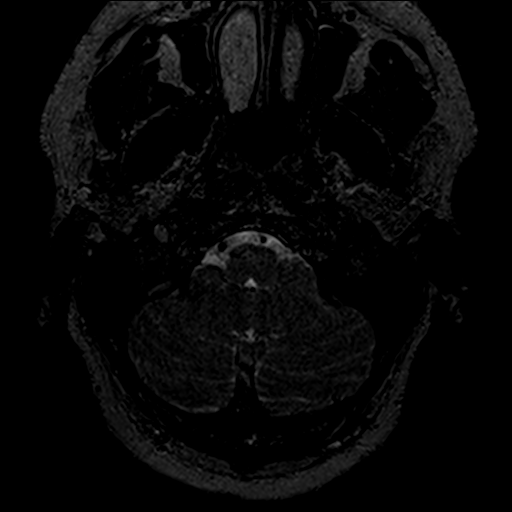
[im 14/40]
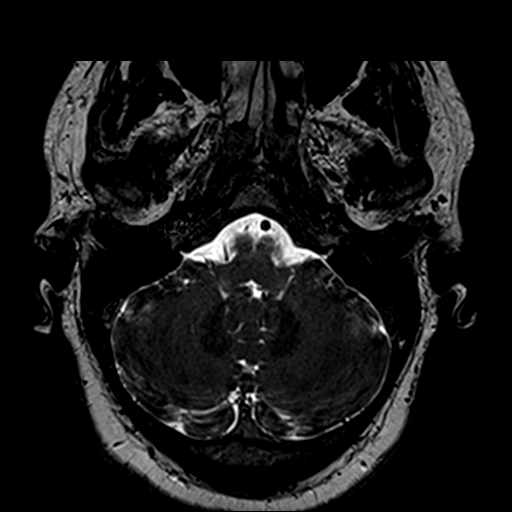
[im 27/40]
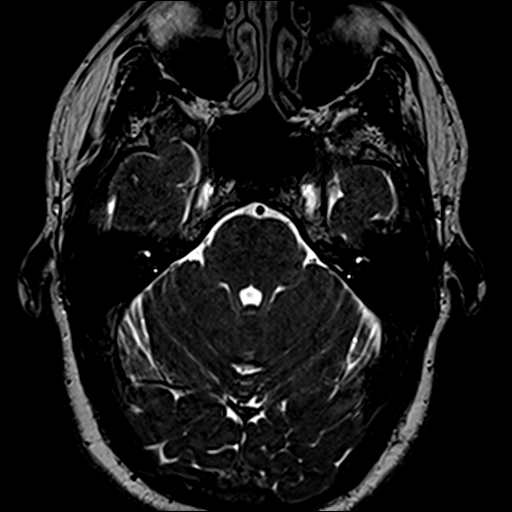
[im 40/40]
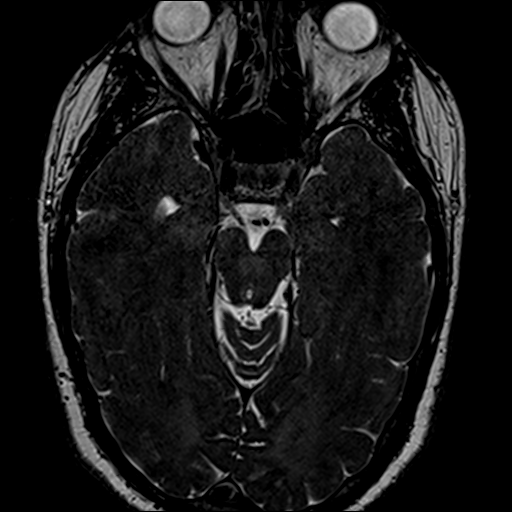

[Series 12: T1 post-contrast · axial · 3.0mm · 0.78mm/px · z∈[-52,-3]mm · 2 of 16 slices shown (1 of 3)]
[im 1/16]
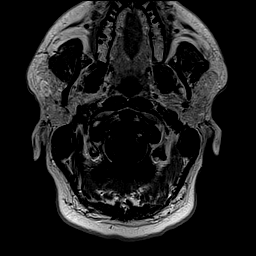
[im 16/16]
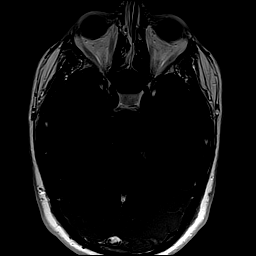

[Series 13: T1 post-contrast · coronal · 3.0mm · 0.78mm/px · 1 of 13 slices shown (2 of 3)]
[im 1/13]
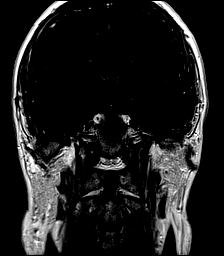

[Series 14: T1 post-contrast · axial · 1.0mm · 1.00mm/px · z∈[-28,+118]mm · 8 of 160 slices shown (3 of 3)]
[im 11/160]
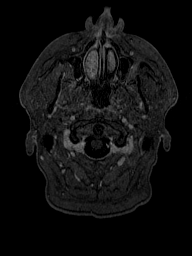
[im 32/160]
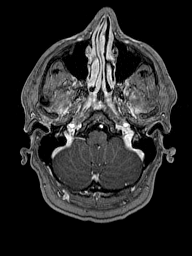
[im 54/160]
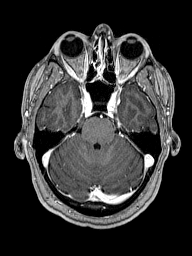
[im 75/160]
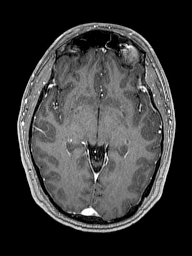
[im 96/160]
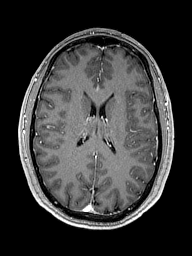
[im 117/160]
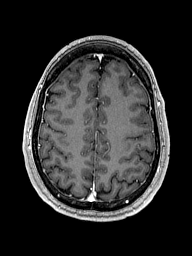
[im 138/160]
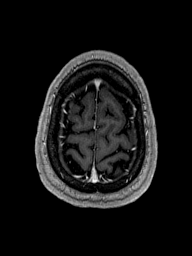
[im 160/160]
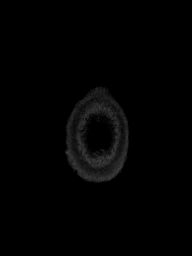

[34 of 48 positions shown; findings below may reference images not displayed]

FINDINGS: Brain: Cerebral volume within normal limits for age. No focal
parenchymal signal abnormality identified. No significant cerebral
white matter disease for age. No abnormal foci of restricted
diffusion to suggest acute or subacute ischemia. No encephalomalacia
to suggest chronic infarction. No foci of susceptibility artifact to
suggest acute or chronic intracranial hemorrhage.

No mass lesion, midline shift or mass effect. No hydrocephalus.
Major dural sinuses are grossly patent. No abnormal enhancement
within the brain.

Pituitary gland suprasellar region normal. Midline structures intact
and normal.

Thin section imaging through the internal auditory canals was
performed. Seventh and eighth cranial nerves are seen coursing
normally through the cerebellopontine angle cisterns into the
internal auditory canals. No CPA angle mass. No intracanalicular
mass or abnormal enhancement. Inner ear structures including the
vestibulae, cochlea, and semi circular canals are normal. Normal
post ganglionic enhancement seen within the seventh cranial nerves
bilaterally. S no mastoid effusion. Mastoid effusion.

Vascular: Normal intravascular flow voids are maintained.

Skull and upper cervical spine: Craniocervical junction normal.
Upper cervical spine normal. Bone marrow signal intensity within
normal limits. No scalp soft tissue abnormality.

Sinuses/Orbits: Globes and orbital soft tissues within normal
limits. Mild scattered mucosal thickening within the ethmoidal air
cells. Paranasal sinuses are otherwise clear.

Other: None.
IMPRESSION: Normal IAC protocol MRI of the brain. No findings to explain
patient's symptoms identified.

## 2020-03-31 ENCOUNTER — Encounter: Payer: Self-pay | Admitting: Family Medicine

## 2020-03-31 ENCOUNTER — Other Ambulatory Visit: Payer: Self-pay

## 2020-03-31 ENCOUNTER — Ambulatory Visit (INDEPENDENT_AMBULATORY_CARE_PROVIDER_SITE_OTHER): Payer: BC Managed Care – PPO | Admitting: Family Medicine

## 2020-03-31 VITALS — BP 132/72 | HR 80 | Temp 97.7°F | Ht 74.5 in | Wt 263.5 lb

## 2020-03-31 DIAGNOSIS — Z1159 Encounter for screening for other viral diseases: Secondary | ICD-10-CM

## 2020-03-31 DIAGNOSIS — E034 Atrophy of thyroid (acquired): Secondary | ICD-10-CM | POA: Diagnosis not present

## 2020-03-31 DIAGNOSIS — Z0000 Encounter for general adult medical examination without abnormal findings: Secondary | ICD-10-CM

## 2020-03-31 DIAGNOSIS — Z114 Encounter for screening for human immunodeficiency virus [HIV]: Secondary | ICD-10-CM

## 2020-03-31 DIAGNOSIS — E559 Vitamin D deficiency, unspecified: Secondary | ICD-10-CM | POA: Diagnosis not present

## 2020-03-31 DIAGNOSIS — R0609 Other forms of dyspnea: Secondary | ICD-10-CM

## 2020-03-31 DIAGNOSIS — R0683 Snoring: Secondary | ICD-10-CM

## 2020-03-31 DIAGNOSIS — R0600 Dyspnea, unspecified: Secondary | ICD-10-CM

## 2020-03-31 DIAGNOSIS — E669 Obesity, unspecified: Secondary | ICD-10-CM

## 2020-03-31 LAB — CBC WITH DIFFERENTIAL/PLATELET
Basophils Absolute: 0 10*3/uL (ref 0.0–0.1)
Basophils Relative: 0.6 % (ref 0.0–3.0)
Eosinophils Absolute: 0.1 10*3/uL (ref 0.0–0.7)
Eosinophils Relative: 1.4 % (ref 0.0–5.0)
HCT: 41.6 % (ref 39.0–52.0)
Hemoglobin: 14.2 g/dL (ref 13.0–17.0)
Lymphocytes Relative: 28.9 % (ref 12.0–46.0)
Lymphs Abs: 1.9 10*3/uL (ref 0.7–4.0)
MCHC: 34.1 g/dL (ref 30.0–36.0)
MCV: 88.5 fl (ref 78.0–100.0)
Monocytes Absolute: 0.3 10*3/uL (ref 0.1–1.0)
Monocytes Relative: 5.3 % (ref 3.0–12.0)
Neutro Abs: 4.1 10*3/uL (ref 1.4–7.7)
Neutrophils Relative %: 63.8 % (ref 43.0–77.0)
Platelets: 237 10*3/uL (ref 150.0–400.0)
RBC: 4.7 Mil/uL (ref 4.22–5.81)
RDW: 13.9 % (ref 11.5–15.5)
WBC: 6.4 10*3/uL (ref 4.0–10.5)

## 2020-03-31 LAB — COMPREHENSIVE METABOLIC PANEL
ALT: 17 U/L (ref 0–53)
AST: 12 U/L (ref 0–37)
Albumin: 4.5 g/dL (ref 3.5–5.2)
Alkaline Phosphatase: 73 U/L (ref 39–117)
BUN: 19 mg/dL (ref 6–23)
CO2: 28 mEq/L (ref 19–32)
Calcium: 9.5 mg/dL (ref 8.4–10.5)
Chloride: 106 mEq/L (ref 96–112)
Creatinine, Ser: 1.32 mg/dL (ref 0.40–1.50)
GFR: 65.27 mL/min (ref >60.00)
Glucose, Bld: 89 mg/dL (ref 70–99)
Potassium: 4.1 mEq/L (ref 3.5–5.1)
Sodium: 140 mEq/L (ref 135–145)
Total Bilirubin: 0.6 mg/dL (ref 0.2–1.2)
Total Protein: 6.9 g/dL (ref 6.0–8.3)

## 2020-03-31 LAB — LIPID PANEL
Cholesterol: 172 mg/dL (ref 0–200)
HDL: 44.8 mg/dL (ref >39.00)
LDL Cholesterol: 96 mg/dL (ref 0–99)
NonHDL: 127.65
Total CHOL/HDL Ratio: 4
Triglycerides: 160 mg/dL — ABNORMAL HIGH (ref 0.0–149.0)
VLDL: 32 mg/dL (ref 0.0–40.0)

## 2020-03-31 LAB — VITAMIN D 25 HYDROXY (VIT D DEFICIENCY, FRACTURES): VITD: 22.58 ng/mL — ABNORMAL LOW (ref 30.00–100.00)

## 2020-03-31 LAB — HEMOGLOBIN A1C: Hgb A1c MFr Bld: 5.1 % (ref 4.6–6.5)

## 2020-03-31 LAB — TSH: TSH: 3.19 u[IU]/mL (ref 0.35–4.50)

## 2020-03-31 NOTE — Patient Instructions (Signed)
Good to see you today  I will send you a message about your labs    You will get a call about pulmonary referral for evaluation of sleep apnea  There is not one right eating plan for everyone.  It may take trial and error to find what will work for you.  It is important to get adequate protein and fiber with your meals.  It is okay to not eat breakfast or to skip meals if you are not hungry.  Avoid snacking between meals.  Unless you are on a fluid restriction, drink 80 to 90 ounces of water a day.  Suggested resources- www.dietdoctor.com/diabetes/diet www.adaptyourlifeacademy.com-there is a quiz to help you determine how many carbohydrates you should eat a day  www.thefastingmethod.com  Here are some guidelines to help you with meal planning -  Avoid all processed and packaged foods (bread, pasta, crackers, chips, etc) and beverages containing calories.  Avoid added sugars and excessive natural sugars.  Pay attention to how you feel if you consume artificial sweeteners.  Do they make you more hungry or raise your blood sugar?  With every meal and snack, aim to get 20 g of protein (3 ounces of meat, 4 ounces of fish, 3 eggs, protein powder, 1 cup [place] yogurt, 1 cup cottage cheese, etc.)  Increase fiber in the form of non-starchy vegetables.  These help you feel full with very little carbohydrates and are good for gut health.  Nonstarchy vegetables include summer squash, onions, peppers, tomatoes, eggplant, broccoli, cauliflower, cabbage, lettuce, spinach.  Have small amounts of good fats such as avocado, nuts, olive oil, nut butters, olives.  Add a little cheese to your meals to make them tasty.   Try to plan your meals for the week and do some meal preparation when able.  If possible, make lunches for the week ahead of time.  Plan a couple of dinners and make enough so you can have leftovers.  Build in a treat once a week.

## 2020-03-31 NOTE — Progress Notes (Signed)
Subjective:    Patient ID: Joshua Barrera, male    DOB: 12-09-1975, 44 y.o.   MRN: [id number]  HPI Chief Complaint  Patient presents with  . New Patient (Initial Visit)   This is a 44 yo male who presents today to establish care. Married with 1 son, 78 yo. Was going to [facility]. Works for [employer] in [employer]. Enjoys hiking with family.    Last CPE- several years ago Tdap- 06/2015 Covid- fully vaccinated Dental- regular Eye- within last 1-2 years Exercise- hiking when he has time Diet- Breakfast- whole wheat breakfast sandwich with egg, cheese. Lunch- sandwich from vending machine or leftovers, Dinner- gets CSA box and family builds meals around vegetables. Gets carryout/ delivery 3x/ week. Eats snacks frequently, little water intake. Rare soda/ juice/ sweet tea.   SOB- was seen at [facility] 8/21. This was accompanied by fever/ URI. No SOB at rest, gets winded more easily with recent weight gain. Doesn't seem to breathe in as deep as he used to. Snores badly at night. No recent apnea reported by his wife. Did have some years ago. Was recommended he get sleep study. Did not follow up.  Sleep- terrible, doesn't feel rested. Sleeps 5 hours a night. Has to get up early for work. Has trouble staying asleep. Increased stress at work. Wakes up with pit in stomach. Little nocturia. Entertaining switching careers. Has been with [employer] for 14 years. Stress over finances.   Has gained weight with pandemic.   Review of Systems  Constitutional: Positive for fatigue.  HENT: Negative.   Eyes: Negative.   Respiratory: Positive for shortness of breath. Negative for apnea, chest tightness and wheezing.   Cardiovascular: Negative.   Gastrointestinal: Positive for abdominal pain (occasional acid indigestion, with OJ, takes omeprazole prn rarely).  Endocrine: Negative.   Genitourinary: Negative.   Musculoskeletal: Negative.   Skin: Negative.   Allergic/Immunologic: Negative.   Neurological:  Negative.   Hematological: Negative.   Psychiatric/Behavioral: Positive for sleep disturbance. Negative for dysphoric mood.       Objective:   Physical Exam Physical Exam  Constitutional: He is oriented to person, place, and time. He appears well-developed and well-nourished. Obese.  HENT:  Head: Normocephalic and atraumatic.  Right Ear: External ear normal. TM normal.  Left Ear: External ear normal.  TM normal.  Nose: Nose normal.  Mouth/Throat: Oropharynx is clear and moist.  Eyes: Conjunctivae are normal.  Neck: Normal range of motion. Neck supple.  Cardiovascular: Normal rate, regular rhythm, normal heart sounds and intact distal pulses.   Pulmonary/Chest: Effort normal and breath sounds normal.  Abdominal: Soft. Bowel sounds are normal. Musculoskeletal: Normal range of motion. He exhibits no edema or tenderness.       Cervical back: Normal.       Thoracic back: Normal.       Lumbar back: Normal.  Lymphadenopathy:    He has no cervical adenopathy.       Right: No inguinal adenopathy present.       Left: No inguinal adenopathy present.  Neurological: He is alert and oriented to person, place, and time.  Skin: Skin is warm and dry.  Psychiatric: He has a normal mood and affect. His behavior is normal. Judgment normal.  Vitals reviewed.     BP 132/72   Pulse 80   Temp 97.7 F (36.5 C) (Temporal)   Ht 6' 2.5" (1.892 m)   Wt 263 lb 8 oz (119.5 kg)   SpO2 97%  BMI 33.38 kg/m  Wt Readings from Last 3 Encounters:  03/31/20 263 lb 8 oz (119.5 kg)  05/21/17 244 lb (110.7 kg)  01/31/17 258 lb 6.4 oz (117.2 kg)   Depression screen [facility] 2/9 03/31/2020 01/31/2017 11/15/2015 08/14/2015 07/17/2015  Decreased Interest 0 0 0 0 0  Down, Depressed, Hopeless 1 0 0 0 0  PHQ - 2 Score 1 0 0 0 0       Assessment & Plan:  1. Annual physical exam - Discussed and encouraged healthy lifestyle choices- adequate sleep, regular exercise, stress management and healthy food choices.   2.  Vitamin D deficiency - Vitamin D, 25-hydroxy  3. Obesity, Class I, BMI 30-34.9 - provided written and verbal information regarding healthy food choices - Lipid Panel - CBC with Differential - TSH - Hemoglobin A1c - Comprehensive metabolic panel - Ambulatory referral to Pulmonology  4. Hypothyroidism due to acquired atrophy of thyroid - was previously on replacement - TSH  5. Screening for HIV without presence of risk factors - HIV Antibody (routine testing w rflx)  6. Encounter for hepatitis C screening test for low risk patient - Hepatitis C antibody  7. Loud snoring - Ambulatory referral to Pulmonology  8. DOE (dyspnea on exertion) - No worrisome findings on history or physical exam, suspect related to weight gain, deconditioning. Continue to monitor for worsening, change  This visit occurred during the SARS-CoV-2 public health emergency.  Safety protocols were in place, including screening questions prior to the visit, additional usage of staff PPE, and extensive cleaning of exam room while observing appropriate contact time as indicated for disinfecting solutions.      Olean Ree, FNP-BC  [facility], [facility]  04/02/2020 8:41 AM

## 2020-04-03 LAB — HIV ANTIBODY (ROUTINE TESTING W REFLEX): HIV 1&2 Ab, 4th Generation: NONREACTIVE

## 2020-04-03 LAB — HEPATITIS C ANTIBODY
Hepatitis C Ab: NONREACTIVE
SIGNAL TO CUT-OFF: 0.01 (ref <1.00)

## 2020-05-16 ENCOUNTER — Institutional Professional Consult (permissible substitution): Payer: BC Managed Care – PPO | Admitting: Pulmonary Disease
